# Patient Record
Sex: Male | Born: 1990 | Race: White | Hispanic: No | Marital: Single | Smoking: Current every day smoker
Health system: Southern US, Community
[De-identification: ages and names within clinical notes are randomized; demographics above are authoritative.]

## PROBLEM LIST (undated history)

## (undated) DIAGNOSIS — J45909 Unspecified asthma, uncomplicated: Secondary | ICD-10-CM

## (undated) HISTORY — DX: Unspecified asthma, uncomplicated: J45.909

## (undated) HISTORY — PX: FRACTURE SURGERY: SHX138

---
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
## 2011-07-01 ENCOUNTER — Ambulatory Visit: Payer: BC Managed Care – PPO

## 2011-07-01 ENCOUNTER — Ambulatory Visit: Payer: BC Managed Care – PPO | Admitting: Physician Assistant

## 2011-07-01 VITALS — BP 115/73 | HR 58 | Temp 98.3°F | Resp 18 | Ht 73.0 in | Wt 230.0 lb

## 2011-07-01 DIAGNOSIS — L270 Generalized skin eruption due to drugs and medicaments taken internally: Secondary | ICD-10-CM

## 2011-07-01 DIAGNOSIS — R21 Rash and other nonspecific skin eruption: Secondary | ICD-10-CM

## 2011-07-01 LAB — POCT CBC
Granulocyte percent: 55.5 %G (ref 37–80)
HCT, POC: 44 % (ref 43.5–53.7)
Hemoglobin: 14 g/dL — AB (ref 14.1–18.1)
Lymph, poc: 3.1 (ref 0.6–3.4)
MCH, POC: 28.8 pg (ref 27–31.2)
MCHC: 31.8 g/dL (ref 31.8–35.4)
MCV: 90.5 fL (ref 80–97)
MPV: 10.3 fL (ref 0–99.8)
POC Granulocyte: 4.9 (ref 2–6.9)
POC LYMPH PERCENT: 35.1 %L (ref 10–50)
POC MID %: 9.4 %M (ref 0–12)
Platelet Count, POC: 248 10*3/uL (ref 142–424)
RDW, POC: 14 %
WBC: 8.9 10*3/uL (ref 4.6–10.2)

## 2011-07-01 LAB — POCT SEDIMENTATION RATE: POCT SED RATE: 22 mm/hr (ref 0–22)

## 2011-07-01 NOTE — Progress Notes (Signed)
  Subjective:    Patient ID: Jesus Harrison, male    DOB: July 01, 1990, 21 y.o.   MRN: [id number]  HPI Jesus Harrison c/o rash for 3 days that has painful lesions on palms/soles only. Lesions migrate within these areas but never resolve.  He denies any mouth lesions.  He states that he has had sinus symptoms for a month or so and takes OTC that his mother gave him without much relief but denies other PO meds.  Denies F/C.    Review of Systems  Constitutional: Negative.   HENT: Positive for congestion, sore throat, rhinorrhea, postnasal drip and sinus pressure.   Respiratory: Positive for cough.   Skin: Positive for rash.  Neurological: Negative for headaches.       Objective:   Physical Exam  Constitutional: He appears well-developed and well-nourished.  HENT:  Mouth/Throat: Oropharynx is clear and moist. No oropharyngeal exudate.       Congested nasal passages  Cardiovascular: Normal rate and regular rhythm.   Pulmonary/Chest: Effort normal and breath sounds normal.  Skin: Rash (multiple red slightly raised tender lesions soles/palms to wrist.  Some with slight target appearance) noted.     Results for orders placed in visit on 07/01/11  POCT CBC      Component Value Range   WBC 8.9  4.6 - 10.2 (K/uL)   Lymph, poc 3.1  0.6 - 3.4    POC LYMPH PERCENT 35.1  10 - 50 (%L)   MID (cbc) 0.8  0 - 0.9    POC MID % 9.4  0 - 12 (%M)   POC Granulocyte 4.9  2 - 6.9    Granulocyte percent 55.5  37 - 80 (%G)   RBC 4.86  4.69 - 6.13 (M/uL)   Hemoglobin 14.0 (*) 14.1 - 18.1 (g/dL)   HCT, POC 44.0  43.5 - 53.7 (%)   MCV 90.5  80 - 97 (fL)   MCH, POC 28.8  27 - 31.2 (pg)   MCHC 31.8  31.8 - 35.4 (g/dL)   RDW, POC 14.0     Platelet Count, POC 248  142 - 424 (K/uL)   MPV 10.3  0 - 99.8 (fL)        Assessment & Plan:  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 Kathreen Cosier warnings given. Saline NS for sinus symptoms at this  point to avoid adding new agents.  Call 48hours with status.  Discussed with and examined by Dr. Everlene Farrier

## 2011-07-01 NOTE — Patient Instructions (Signed)
Take Zyrtec 10 mg 1 every 12 hours for 2 days then 1 a day for up to 7-10 days. Watch for any new lesions in mouth, fever, worsening rash. Use Saline nasal sprays for congestion. Try to avoid any medications that have not been recommended today.   Erythema Multiforme Erythema multiforme (EM) is a rash that occurs mostly on the skin. Sometimes it occurs on the lips and mouth. It is usually a mild illness that goes away on its own. It usually affects young adults in the spring and fall. It tends to be recurrent with each episode lasting 1 to 4 weeks. CAUSES  The cause of EM may be an overreaction by the body's immune system to a trigger (something that causes the body to react).  Common triggers include:  Infections, including:   Viruses.   Bacteria.   Fungi.   Parasites.   Medicines.  Less common triggers include:  Foods.   Chemicals.   Injuries to the skin.   Pregnancy.   Other illnesses.  In some cases the cause may not be known. SYMPTOMS  The rash from EM shows up suddenly. The rash may appear days after the trigger. It may start as small, red, round or oval marks that become bumps or raised welts over 24 to 48 hours. These can spread and be quite large (about one inch [several centimeters]). These skin changes usually appear first on the backs of the hands, then spread to the tops of the feet, arms, elbows, knees, palms and soles. There may be a mild rash on the lips and lining of the mouth. The skin rash may show up in waves over a few days. There may be mild itching or burning of the skin at first. It may take up to 4 weeks to go away. The rash may come back again at a later time. DIAGNOSIS  Diagnosis of EM is usually made by physical exam. Sometimes a skin biopsy is done if the diagnosis is not certain. A skin biopsy is the removal of a small piece of tissue which can be examined under a microscope by a specialist (pathologist). TREATMENT  Most episodes of EM heal on  their own and treatment may not be needed. If possible, it is best to remove the trigger or treat the infection. If your trigger is a herpes virus infection (cold sore), use sunscreen lotion and sunscreen-containing lip balm to prevent sunlight triggered outbreaks of herpes virus. Medicine for itching may be given. Medicines can be used for severe cases and to prevent repeat bouts of EM.  HOME CARE INSTRUCTIONS   If possible, avoid known triggers.   If a medicine was your trigger, be sure to notify all of your caregivers. You should avoid this medicine or any like it in the future.  SEEK MEDICAL CARE IF:   Your EM rash shows up again in the future  SEEK IMMEDIATE MEDICAL CARE IF:   Red, swollen lips or mouth develop.   Burning feeling in the mouth or lips.   Blisters or open sores in the mouth, lips, vagina, penis or anus.   Eye pain, redness or drainage.   Blisters on the skin.   Difficulty breathing.   Difficulty swallowing; drooling.   Blood in urine.   Pain with urinating.  Document Released: 04/27/2005 Document Revised: 01/07/2011 Document Reviewed: 04/13/2008 [facility] Patient Information 2012 [place].

## 2011-07-03 ENCOUNTER — Telehealth: Payer: Self-pay

## 2011-07-03 NOTE — Telephone Encounter (Signed)
Jesus Harrison: Pt states that he still has a rash and would like to discuss this with you.

## 2011-07-05 NOTE — Telephone Encounter (Signed)
LMOM to CB. 

## 2011-07-06 NOTE — Telephone Encounter (Signed)
Called pt, no vm. Unable to leave message

## 2011-07-07 NOTE — Telephone Encounter (Signed)
LMOM at cell # to CB. No ans, no VM on H #

## 2011-07-09 NOTE — Telephone Encounter (Signed)
Reached pt who reported that his rash has resolved so he no longer needs anything.

## 2013-01-13 ENCOUNTER — Encounter (HOSPITAL_COMMUNITY): Payer: Self-pay | Admitting: Emergency Medicine

## 2013-01-13 ENCOUNTER — Emergency Department (INDEPENDENT_AMBULATORY_CARE_PROVIDER_SITE_OTHER)
Admission: EM | Admit: 2013-01-13 | Discharge: 2013-01-13 | Disposition: A | Discharge status: Home / Self Care | Payer: BC Managed Care – PPO | Source: Home / Self Care

## 2013-01-13 DIAGNOSIS — T148XXA Other injury of unspecified body region, initial encounter: Secondary | ICD-10-CM

## 2013-01-13 DIAGNOSIS — S139XXA Sprain of joints and ligaments of unspecified parts of neck, initial encounter: Secondary | ICD-10-CM

## 2013-01-13 DIAGNOSIS — S0101XA Laceration without foreign body of scalp, initial encounter: Secondary | ICD-10-CM

## 2013-01-13 DIAGNOSIS — S161XXA Strain of muscle, fascia and tendon at neck level, initial encounter: Secondary | ICD-10-CM

## 2013-01-13 DIAGNOSIS — S0100XA Unspecified open wound of scalp, initial encounter: Secondary | ICD-10-CM

## 2013-01-13 DIAGNOSIS — M549 Dorsalgia, unspecified: Secondary | ICD-10-CM

## 2013-01-13 MED ORDER — TRAMADOL HCL 50 MG PO TABS: 50.0000 mg | ORAL_TABLET | Freq: Four times a day (QID) | ORAL | Status: DC | PRN

## 2013-01-13 MED ORDER — DICLOFENAC POTASSIUM 50 MG PO TABS: 50.0000 mg | ORAL_TABLET | Freq: Three times a day (TID) | ORAL | Status: DC
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
## 2013-01-14 NOTE — ED Provider Notes (Signed)
Medical screening examination/treatment/procedure(s) were performed by resident physician or non-physician practitioner and as supervising physician I was immediately available for consultation/collaboration.   KINDL,JAMES DOUGLAS MD.   James D Kindl, MD 01/14/13 1106 

## 2013-12-06 ENCOUNTER — Ambulatory Visit (INDEPENDENT_AMBULATORY_CARE_PROVIDER_SITE_OTHER): Payer: BC Managed Care – PPO | Admitting: Family Medicine

## 2013-12-06 VITALS — BP 122/70 | HR 88 | Temp 98.1°F | Resp 16 | Ht 72.0 in | Wt 256.4 lb

## 2013-12-06 DIAGNOSIS — B360 Pityriasis versicolor: Secondary | ICD-10-CM

## 2013-12-06 MED ORDER — FLUCONAZOLE 200 MG PO TABS: 200.0000 mg | ORAL_TABLET | Freq: Every day | ORAL | Status: DC

## 2013-12-06 NOTE — Progress Notes (Signed)
Subjective:  This chart was scribed for Jesus Lauenstein, MD by Jesus Harrison, ED Scribe. This pElvina Sidleatient was seen in room 3 and the patient's care was started at 5:01 PM.   Patient ID: Jesus Harrison, male    DOB: Jun 04, 1990, 23 y.o.   MRN: [id number]  Rash Pertinent negatives include no fever.   Chief Complaint  Patient presents with   Rash    x2 months pt states he has an itchy rash.  Located on upper chest, back and some on right/left arm.  denies fever, chills. pt states he has tried OTC creams with no relief.  Also when he gets hot the rash itch more   HPI Comments: Jesus Mosealen Parlin is a 23 y.o. male who presents to the [facility] complaining of a worsening, itchy, erythematous rash located to entire body. Pt reports rash was Harrison and intermittent for the first 2 months but is now constant and has spread to entire body, majority on his back and chest. He reports rash worsens with heat. Pt has tried OTC medication without relief.   Pt is a Engineer, agriculturalproduction worker in a ware house.  There are no active problems to display for this patient.  Past Medical History  Diagnosis Date   Asthma    Past Surgical History  Procedure Laterality Date   Fracture surgery     No Known Allergies Prior to Admission medications   Medication Sig Start Date End Date Taking? Authorizing Provider  DiphenhydrAMINE HCl (BENADRYL ALLERGY PO) Take by mouth as needed.   Yes Historical Provider, MD   History   Social History   Marital Status: Single    Spouse Name: N/A    Number of Children: N/A   Years of Education: N/A   Occupational History   Not on file.   Social History Main Topics   Smoking status: Current Every Day Smoker -- 1.00 packs/day for 6 years    Types: Cigarettes   Smokeless tobacco: Never Used   Alcohol Use: 3.6 oz/week    6 Cans of beer per week     Comment: one drink every 2 weeks   Drug Use: No   Sexual Activity: Not on file   Other Topics Concern     Not on file   Social History Narrative   No narrative on file   Review of Systems  Constitutional: Negative for fever and chills.  Skin: Positive for rash.   Objective:   Physical Exam  Nursing note and vitals reviewed. Constitutional: He is oriented to person, place, and time. He appears well-developed and well-nourished. No distress.  HENT:  Head: Normocephalic and atraumatic.  Eyes: Conjunctivae and EOM are normal.  Neck: Neck supple.  Cardiovascular: Normal rate.   Pulmonary/Chest: Effort normal.  Musculoskeletal: Normal range of motion.  Neurological: He is alert and oriented to person, place, and time.  Skin: Skin is warm and dry. Rash noted.  difusse serpiginous mildly elevated confluent rash on back.  He is has patchy eczematous type rash in the antecubital area.   Psychiatric: He has a normal mood and affect. His behavior is normal.   Filed Vitals:   12/06/13 1622  BP: 122/70  Pulse: 88  Temp: 98.1 F (36.7 C)  Resp: 16       Assessment & Plan:   1. Tinea versicolor    Meds ordered this encounter  Medications   DiphenhydrAMINE HCl (BENADRYL ALLERGY PO)    Sig: Take by mouth as needed.  fluconazole (DIFLUCAN) 200 MG tablet    Sig: Take 1 tablet (200 mg total) by mouth daily. 2 tablets at once today, repeat in one week    Dispense:  4 tablet    Refill:  0    Elvina Sidle, MD

## 2013-12-06 NOTE — Patient Instructions (Signed)
Tinea Versicolor Tinea versicolor is a common yeast infection of the skin. This condition becomes known when the yeast on our skin starts to overgrow (yeast is a normal inhabitant on our skin). This condition is noticed as white or light brown patches on brown skin, and is more evident in the summer on tanned skin. These areas are slightly scaly if scratched. The light patches from the yeast become evident when the yeast creates "holes in your suntan". This is most often noticed in the summer. The patches are usually located on the chest, back, pubis, neck and body folds. However, it may occur on any area of body. Mild itching and inflammation (redness or soreness) may be present. DIAGNOSIS  The diagnosisof this is made clinically (by looking). Cultures from samples are usually not needed. Examination under the microscope may help. However, yeast is normally found on skin. The diagnosis still remains clinical. Examination under Wood's Ultraviolet Light can determine the extent of the infection. TREATMENT  This common infection is usually only of cosmetic (only a concern to your appearance). It is easily treated with dandruff shampoo used during showers or bathing. Vigorous scrubbing will eliminate the yeast over several days time. The light areas in your skin may remain for weeks or months after the infection is cured unless your skin is exposed to sunlight. The lighter or darker spots caused by the fungus that remain after complete treatment are not a sign of treatment failure; it will take a long time to resolve. Your caregiver may recommend a number of commercial preparations or medication by mouth if home care is not working. Recurrence is common and preventative medication may be necessary. This skin condition is not highly contagious. Special care is not needed to protect close friends and family members. Normal hygiene is usually enough. Follow up is required only if you develop complications (such as a  secondary infection from scratching), if recommended by your caregiver, or if no relief is obtained from the preparations used. Document Released: 04/24/2000 Document Revised: 07/20/2011 Document Reviewed: 06/06/2008 [employer] Patient Information 2015 [employer]. This information is not intended to replace advice given to you by your health care provider. Make sure you discuss any questions you have with your health care provider.  

## 2014-11-05 ENCOUNTER — Ambulatory Visit (INDEPENDENT_AMBULATORY_CARE_PROVIDER_SITE_OTHER): Payer: BLUE CROSS/BLUE SHIELD | Admitting: Internal Medicine

## 2014-11-05 VITALS — BP 106/74 | HR 77 | Temp 98.5°F | Resp 16 | Ht 73.0 in | Wt 274.0 lb

## 2014-11-05 DIAGNOSIS — B360 Pityriasis versicolor: Secondary | ICD-10-CM

## 2014-11-05 MED ORDER — FLUCONAZOLE 200 MG PO TABS: ORAL_TABLET | ORAL | Status: DC

## 2014-11-05 NOTE — Progress Notes (Signed)
   Subjective:  This chart was scribed for Ellamae Siaobert Doolittle, MD by Stann Oresung-Kai Tsai, Medical Scribe. This patient was seen in Room 5 and the patient's care was started at 5:17 PM.     Patient ID: Jesus Harrison, male    DOB: [birth year], 24 y.o.   MRN: [id number]  HPI Jesus Harrison is a [age] y.o. male who [facility] complaining of gradual onset rash on his right shoulder. He was here last year with similar symptoms. Last year, he took 2 pills in the past and it resolved the rash. However, it still came back this year. See visit last year with use of Diflucan.   There are no active problems to display for this patient.   Current outpatient prescriptions:  .  DiphenhydrAMINE HCl (BENADRYL ALLERGY PO), Take by mouth as needed., Disp: , Rfl:      Review of Systems  Constitutional: Negative for fever, chills, diaphoresis, fatigue and unexpected weight change.  HENT: Negative for sore throat.   Respiratory: Negative for cough and shortness of breath.   Gastrointestinal: Negative for nausea, vomiting, diarrhea and constipation.  Skin: Positive for rash (right shoulder).  Neurological: Negative for dizziness, numbness and headaches.       Objective:   Physical Exam  Constitutional: He is oriented to person, place, and time. He appears well-developed and well-nourished. No distress.  HENT:  Head: Normocephalic and atraumatic.  Eyes: EOM are normal. Pupils are equal, round, and reactive to light.  Neck: Neck supple.  Cardiovascular: Normal rate.   Pulmonary/Chest: Effort normal. No respiratory distress.  Musculoskeletal: Normal range of motion.  Neurological: He is alert and oriented to person, place, and time.  Skin: Skin is warm and dry.  He has a hypopigmented rash with multiple lesions over his back and chest with fine scaly borders  Psychiatric: He has a normal mood and affect. His behavior is normal.  Nursing note and vitals reviewed.         Assessment & Plan:  Tinea  versi color relapse  Meds ordered this encounter  Medications  . fluconazole (DIFLUCAN) 200 MG tablet    Sig: 1 tablet once a week for 10 weeks    Dispense:  10 tablet    Refill:  0   prolonged treatment to try to prevent relapse  I have completed the patient encounter in its entirety as documented by the scribe, with editing by me where necessary. Robert P. Merla Richesoolittle, M.D.

## 2015-05-28 ENCOUNTER — Emergency Department (HOSPITAL_COMMUNITY): Payer: BLUE CROSS/BLUE SHIELD

## 2015-05-28 ENCOUNTER — Emergency Department (HOSPITAL_COMMUNITY)
Admission: EM | Admit: 2015-05-28 | Discharge: 2015-05-28 | Disposition: A | Discharge status: Home / Self Care | Payer: BLUE CROSS/BLUE SHIELD | Attending: Emergency Medicine | Admitting: Emergency Medicine

## 2015-05-28 ENCOUNTER — Encounter (HOSPITAL_COMMUNITY): Payer: Self-pay

## 2015-05-28 DIAGNOSIS — S60222A Contusion of left hand, initial encounter: Secondary | ICD-10-CM | POA: Diagnosis not present

## 2015-05-28 DIAGNOSIS — S42122A Displaced fracture of acromial process, left shoulder, initial encounter for closed fracture: Secondary | ICD-10-CM

## 2015-05-28 DIAGNOSIS — S0081XA Abrasion of other part of head, initial encounter: Secondary | ICD-10-CM | POA: Insufficient documentation

## 2015-05-28 DIAGNOSIS — S40212A Abrasion of left shoulder, initial encounter: Secondary | ICD-10-CM | POA: Insufficient documentation

## 2015-05-28 DIAGNOSIS — J45909 Unspecified asthma, uncomplicated: Secondary | ICD-10-CM | POA: Insufficient documentation

## 2015-05-28 DIAGNOSIS — S8002XA Contusion of left knee, initial encounter: Secondary | ICD-10-CM | POA: Insufficient documentation

## 2015-05-28 DIAGNOSIS — S50812A Abrasion of left forearm, initial encounter: Secondary | ICD-10-CM | POA: Diagnosis not present

## 2015-05-28 DIAGNOSIS — S8392XA Sprain of unspecified site of left knee, initial encounter: Secondary | ICD-10-CM

## 2015-05-28 DIAGNOSIS — S63502A Unspecified sprain of left wrist, initial encounter: Secondary | ICD-10-CM

## 2015-05-28 DIAGNOSIS — S199XXA Unspecified injury of neck, initial encounter: Secondary | ICD-10-CM | POA: Insufficient documentation

## 2015-05-28 DIAGNOSIS — S0993XA Unspecified injury of face, initial encounter: Secondary | ICD-10-CM

## 2015-05-28 DIAGNOSIS — S0990XA Unspecified injury of head, initial encounter: Secondary | ICD-10-CM

## 2015-05-28 DIAGNOSIS — Y998 Other external cause status: Secondary | ICD-10-CM | POA: Insufficient documentation

## 2015-05-28 DIAGNOSIS — S4992XA Unspecified injury of left shoulder and upper arm, initial encounter: Secondary | ICD-10-CM | POA: Diagnosis present

## 2015-05-28 DIAGNOSIS — S8001XA Contusion of right knee, initial encounter: Secondary | ICD-10-CM | POA: Insufficient documentation

## 2015-05-28 DIAGNOSIS — S60512A Abrasion of left hand, initial encounter: Secondary | ICD-10-CM | POA: Insufficient documentation

## 2015-05-28 DIAGNOSIS — S3991XA Unspecified injury of abdomen, initial encounter: Secondary | ICD-10-CM | POA: Diagnosis not present

## 2015-05-28 DIAGNOSIS — T148XXA Other injury of unspecified body region, initial encounter: Secondary | ICD-10-CM

## 2015-05-28 DIAGNOSIS — Y92410 Unspecified street and highway as the place of occurrence of the external cause: Secondary | ICD-10-CM | POA: Insufficient documentation

## 2015-05-28 DIAGNOSIS — Y9389 Activity, other specified: Secondary | ICD-10-CM | POA: Insufficient documentation

## 2015-05-28 DIAGNOSIS — F17210 Nicotine dependence, cigarettes, uncomplicated: Secondary | ICD-10-CM | POA: Diagnosis not present

## 2015-05-28 LAB — I-STAT CHEM 8, ED
BUN: 11 mg/dL (ref 6–20)
CALCIUM ION: 1.06 mmol/L — AB (ref 1.12–1.23)
Chloride: 109 mmol/L (ref 101–111)
Creatinine, Ser: 1.3 mg/dL — ABNORMAL HIGH (ref 0.61–1.24)
Glucose, Bld: 96 mg/dL (ref 65–99)
HCT: 51 % (ref 39.0–52.0)
Hemoglobin: 17.3 g/dL — ABNORMAL HIGH (ref 13.0–17.0)
Potassium: 3.8 mmol/L (ref 3.5–5.1)
Sodium: 147 mmol/L — ABNORMAL HIGH (ref 135–145)
TCO2: 21 mmol/L (ref 0–100)

## 2015-05-28 LAB — CBC WITH DIFFERENTIAL/PLATELET
BASOS ABS: 0 10*3/uL (ref 0.0–0.1)
Basophils Relative: 0 %
EOS PCT: 2 %
Eosinophils Absolute: 0.3 10*3/uL (ref 0.0–0.7)
HCT: 46.4 % (ref 39.0–52.0)
Hemoglobin: 15.9 g/dL (ref 13.0–17.0)
Lymphocytes Relative: 14 %
Lymphs Abs: 2.4 10*3/uL (ref 0.7–4.0)
MCH: 31 pg (ref 26.0–34.0)
MCHC: 34.3 g/dL (ref 30.0–36.0)
MCV: 90.4 fL (ref 78.0–100.0)
MONO ABS: 0.9 10*3/uL (ref 0.1–1.0)
Monocytes Relative: 5 %
Neutro Abs: 13.4 10*3/uL — ABNORMAL HIGH (ref 1.7–7.7)
Neutrophils Relative %: 79 %
Platelets: 244 10*3/uL (ref 150–400)
RBC: 5.13 MIL/uL (ref 4.22–5.81)
RDW: 13.6 % (ref 11.5–15.5)
WBC: 17.1 10*3/uL — AB (ref 4.0–10.5)

## 2015-05-28 LAB — I-STAT CG4 LACTIC ACID, ED: LACTIC ACID, VENOUS: 1.92 mmol/L (ref 0.5–2.0)

## 2015-05-28 MED ORDER — SODIUM CHLORIDE 0.9 % IV BOLUS (SEPSIS)
1000.0000 mL | Freq: Once | INTRAVENOUS | Status: AC
  Administered 2015-05-28: 1000 mL via INTRAVENOUS

## 2015-05-28 MED ORDER — CYCLOBENZAPRINE HCL 10 MG PO TABS: 10.0000 mg | ORAL_TABLET | Freq: Two times a day (BID) | ORAL | Status: DC | PRN

## 2015-05-28 MED ORDER — HYDROCODONE-ACETAMINOPHEN 5-325 MG PO TABS: 1.0000 | ORAL_TABLET | Freq: Four times a day (QID) | ORAL | Status: DC | PRN

## 2015-05-28 MED ORDER — NAPROXEN 500 MG PO TABS: 500.0000 mg | ORAL_TABLET | Freq: Two times a day (BID) | ORAL | Status: DC

## 2015-05-28 MED ORDER — IOHEXOL 300 MG/ML  SOLN
100.0000 mL | Freq: Once | INTRAMUSCULAR | Status: AC | PRN
  Administered 2015-05-28: 100 mL via INTRAVENOUS

## 2015-05-28 MED ORDER — ONDANSETRON HCL 4 MG/2ML IJ SOLN
4.0000 mg | Freq: Once | INTRAMUSCULAR | Status: AC
  Administered 2015-05-28: 4 mg via INTRAVENOUS
  Filled 2015-05-28: qty 2

## 2015-05-28 MED ORDER — MORPHINE SULFATE (PF) 4 MG/ML IV SOLN
4.0000 mg | Freq: Once | INTRAVENOUS | Status: AC
  Administered 2015-05-28: 4 mg via INTRAVENOUS
  Filled 2015-05-28: qty 1

## 2015-05-28 NOTE — ED Notes (Signed)
EDP at bedside  
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
## 2015-07-09 ENCOUNTER — Ambulatory Visit (INDEPENDENT_AMBULATORY_CARE_PROVIDER_SITE_OTHER): Payer: BLUE CROSS/BLUE SHIELD | Admitting: Family Medicine

## 2015-07-09 DIAGNOSIS — F4310 Post-traumatic stress disorder, unspecified: Secondary | ICD-10-CM | POA: Diagnosis not present

## 2015-07-09 DIAGNOSIS — F4322 Adjustment disorder with anxiety: Secondary | ICD-10-CM

## 2015-07-09 DIAGNOSIS — L501 Idiopathic urticaria: Secondary | ICD-10-CM

## 2015-07-09 MED ORDER — MONTELUKAST SODIUM 10 MG PO TABS: 10.0000 mg | ORAL_TABLET | Freq: Every day | ORAL | Status: DC

## 2015-07-09 MED ORDER — CETIRIZINE HCL 10 MG PO TABS: 10.0000 mg | ORAL_TABLET | Freq: Every day | ORAL | Status: DC

## 2015-07-09 MED ORDER — SERTRALINE HCL 50 MG PO TABS: ORAL_TABLET | ORAL | Status: DC

## 2015-07-09 MED ORDER — HYDROXYZINE HCL 50 MG PO TABS: 50.0000 mg | ORAL_TABLET | ORAL | Status: DC | PRN

## 2015-07-09 MED ORDER — RANITIDINE HCL 150 MG PO TABS: 150.0000 mg | ORAL_TABLET | Freq: Two times a day (BID) | ORAL | Status: DC

## 2015-07-09 NOTE — Patient Instructions (Signed)
Hives Hives are itchy, red, swollen areas of the skin. They can vary in size and location on your body. Hives can come and go for hours or several days (acute hives) or for several weeks (chronic hives). Hives do not spread from person to person (noncontagious). They may get worse with scratching, exercise, and emotional stress. CAUSES   Allergic reaction to food, additives, or drugs.  Infections, including the common cold.  Illness, such as vasculitis, lupus, or thyroid disease.  Exposure to sunlight, heat, or cold.  Exercise.  Stress.  Contact with chemicals. SYMPTOMS   Red or white swollen patches on the skin. The patches may change size, shape, and location quickly and repeatedly.  Itching.  Swelling of the hands, feet, and face. This may occur if hives develop deeper in the skin. DIAGNOSIS  Your caregiver can usually tell what is wrong by performing a physical exam. Skin or blood tests may also be done to determine the cause of your hives. In some cases, the cause cannot be determined. TREATMENT  Mild cases usually get better with medicines such as antihistamines. Severe cases may require an emergency epinephrine injection. If the cause of your hives is known, treatment includes avoiding that trigger.  HOME CARE INSTRUCTIONS   Avoid causes that trigger your hives.  Take antihistamines as directed by your caregiver to reduce the severity of your hives. Non-sedating or low-sedating antihistamines are usually recommended. Do not drive while taking an antihistamine.  Take any other medicines prescribed for itching as directed by your caregiver.  Wear loose-fitting clothing.  Keep all follow-up appointments as directed by your caregiver. SEEK MEDICAL CARE IF:   You have persistent or severe itching that is not relieved with medicine.  You have painful or swollen joints. SEEK IMMEDIATE MEDICAL CARE IF:   You have a fever.  Your tongue or lips are swollen.  You have  trouble breathing or swallowing.  You feel tightness in the throat or chest.  You have abdominal pain. These problems may be the first sign of a life-threatening allergic reaction. Call your local emergency services (911 in [place] MAKE SURE YOU:   Understand these instructions.  Will watch your condition.  Will get help right away if you are not doing well or get worse.   This information is not intended to replace advice given to you by your health care provider. Make sure you discuss any questions you have with your health care provider.   Document Released: 04/27/2005 Document Revised: 05/02/2013 Document Reviewed: 07/21/2011 Elsevier Interactive Patient Education 2016 [employer] Posttraumatic Stress Disorder Posttraumatic stress disorder (PTSD) is a mental disorder. It occurs after a traumatic event in your life. The traumatic events that cause PTSD are outside the range of normal human experience. Examples of these events include war, automobile accidents, natural disasters, rape, domestic violence, and violent crimes. Most people who experience these types of events are able to heal on their own. Those who do not heal develop PTSD. PTSD can happen to anyone at any age. However, people with a history of childhood abuse are at increased risk for developing PTSD.  SYMPTOMS  The traumatic event that causes PTSD must be a threat to life, cause serious injury, or involve sexual violence. The traumatic event is usually experienced directly by the person who develops PTSD. Sometimes PTSD occurs in people who witness traumas that occur to others or who hear about a trauma that occurs to a close family member or friend. The following behaviors  are characteristic of people with PTSD:  People with PTSD re-experience the traumatic event in one or more of the following ways (intrusion symptoms):  Recurrent, unwanted distressing memories while awake.  Recurrent distressing  dreams.  Sensations similar to those felt when the event originally occurred (flashbacks).   Intense or prolonged emotional distress, triggered by reminders of the trauma. This may include fear, horror, intense sadness, or anger.  Marked physical reactions, triggered by reminders of the trauma. This may include racing heart, shortness of breath, sweating, and shaking.  People with PTSD avoid thoughts, conversations, people, or activities that remind them of the traumatic event (avoidance symptoms).  People with PTSD have negative changes in their thinking and mood after the traumatic event. These changes include:  Inability to remember one or more significant aspects of the traumatic event (memory gaps).  Exaggerated negative perceptions about themselves or others, such as believing that they are bad people or that no one can be trusted.  Unrealistic assignment of blame to themselves or others for the traumatic event.  Persistent negative emotional state, such as fear, horror, anger, sadness, guilt, or shame.  Markedly decreased interest or participation in significant activities.  A loss of connection with other people.  Inability to experience positive emotions, such as happiness or love.  People with PTSD are more sensitive to their environment and react more easily than others (hyperarousal-overreactivity symptoms). These symptoms include:  Irritability, with angry outbursts toward other people or objects. The outbursts are easily triggered and may be verbal or physical.  Careless or self-destructive behavior. This may include reckless driving or drug use.  A feeling of being on edge, with increased alertness (hypervigilance).  Exaggerated reactions to stimuli, such as being easily startled.   Difficulty concentrating.  Difficulty sleeping. PTSD symptoms may start soon after a frightening event or months or years later. They last at least 1 month or longer and can  affect one or more areas of functioning, such as social or occupational functioning.  DIAGNOSIS  PTSD is diagnosed through an assessment by a mental health professional. Bonita Quin will be asked questions about the traumatic events in your life. You will also be asked about how these events have changed your thoughts, mood, behavior, and ability to function on a daily basis. You may be asked about your use of alcohol or drugs, which can make PTSD symptoms worse. TREATMENT  Unlike many mental disorders, which require lifelong management, PTSD is a curable condition. The goal of PTSD treatment is to neutralize the negative effects of the traumatic event on daily functioning, not erase the memory of the event. The following treatments may be prescribed to reach this goal:  Medicines. Certain medicines can reduce some PTSD symptoms. Intrusion symptoms and hyperarousal-overactivity symptoms respond best to medicines.  Counseling (talk therapy). Talk therapy with a mental health professional who is experienced in treating PTSD can help. Talk therapy can provide education, emotional support, and coping skills. Certain types of talk therapy that specifically target the traumatic events are the most effective treatment for PTSD:  Prolonged exposure therapy, which involves remembering and processing the traumatic event with a therapist in a safe environment until it no longer creates a negative emotional response.  Eye movement desensitization and reprocessing therapy, which involves the use of repetitive physical stimulation of the senses that alternates between the right and left sides of the body. It is believed that this therapy facilitates communication between the two sides of the brain. This communication helps the  mind to integrate the fragmented memories of the traumatic event into a whole story that makes sense and no longer creates a negative emotional response. Most people with PTSD benefit from a  combination of these treatments.    This information is not intended to replace advice given to you by your health care provider. Make sure you discuss any questions you have with your health care provider.   Document Released: 01/20/2001 Document Revised: 05/18/2014 Document Reviewed: 07/14/2012 Elsevier Interactive Patient Education [employer]
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
## 2015-07-10 ENCOUNTER — Telehealth: Payer: Self-pay

## 2015-07-10 NOTE — Telephone Encounter (Signed)
Pt states he was treated last night and the medication he was given hasn't helped at all, seems to be worse. Please call [phone]    [facility] [place]

## 2015-07-11 ENCOUNTER — Ambulatory Visit (INDEPENDENT_AMBULATORY_CARE_PROVIDER_SITE_OTHER): Payer: BLUE CROSS/BLUE SHIELD | Admitting: Family Medicine

## 2015-07-11 VITALS — BP 120/86 | HR 82 | Temp 98.3°F | Resp 17 | Ht 73.0 in | Wt 270.0 lb

## 2015-07-11 DIAGNOSIS — F411 Generalized anxiety disorder: Secondary | ICD-10-CM | POA: Diagnosis not present

## 2015-07-11 DIAGNOSIS — L500 Allergic urticaria: Secondary | ICD-10-CM

## 2015-07-11 DIAGNOSIS — F4310 Post-traumatic stress disorder, unspecified: Secondary | ICD-10-CM

## 2015-07-11 MED ORDER — PREDNISONE 20 MG PO TABS: ORAL_TABLET | ORAL | Status: DC

## 2015-07-11 MED ORDER — CLONAZEPAM 0.5 MG PO TABS: 0.5000 mg | ORAL_TABLET | Freq: Two times a day (BID) | ORAL | Status: DC | PRN

## 2015-07-11 NOTE — Telephone Encounter (Signed)
Patient has checked in to be seen today.

## 2015-07-11 NOTE — Patient Instructions (Addendum)
Continue your current medications which include Zoloft (sertraline), Zantac (ranitidine), Zyrtec (cetirizine), Singulair (montelukast). You can hold off on the hydroxyzine, but still take it if needed or if it helps.  Take the clonazepam 0.5 mg one twice daily for anxiety  Take the prednisone 20 mg 3 tablets daily for 2 days, then 2 daily for 2 days, then 1 daily for 2 days then one half daily for 4 days  Avoid getting overheated  Return at anytime if worse, or go to the emergency room if necessary  If you're not clearing substantially by tomorrow afternoon you will need to stay off work.  I recommend you see Karmen Bongo or Nicole Cella for counseling:  [phone].  You need to call them to set up your own appointment, but let their office know that I have made the referral and that might help you get on in sooner. Continue to see your pastor for counseling also.
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
## 2015-07-11 NOTE — Telephone Encounter (Addendum)
Needs to come back in 

## 2015-07-12 ENCOUNTER — Telehealth: Payer: Self-pay | Admitting: Family Medicine

## 2015-07-15 ENCOUNTER — Telehealth: Payer: Self-pay

## 2015-07-15 NOTE — Telephone Encounter (Signed)
clonazePAM 0.5 mg are not doing the job and he wanted to let Dr. Alwyn RenHopper know this.  Please advise   [phone]

## 2015-07-16 NOTE — Telephone Encounter (Signed)
  Call: Tell him he can increase clonazepam to 2 pills twice daily.  Then return in about 3 days for recheck.

## 2015-07-16 NOTE — Telephone Encounter (Signed)
Spoke with pt, advised message from Dr. Alwyn RenHopper.

## 2015-08-08 ENCOUNTER — Other Ambulatory Visit: Payer: Self-pay | Admitting: Family Medicine

## 2015-08-12 ENCOUNTER — Other Ambulatory Visit: Payer: Self-pay | Admitting: Family Medicine

## 2015-08-12 DIAGNOSIS — F411 Generalized anxiety disorder: Secondary | ICD-10-CM

## 2015-08-12 MED ORDER — CLONAZEPAM 0.5 MG PO TABS: 0.5000 mg | ORAL_TABLET | Freq: Two times a day (BID) | ORAL | Status: DC | PRN

## 2015-09-03 ENCOUNTER — Other Ambulatory Visit: Payer: Self-pay | Admitting: Family Medicine

## 2015-12-13 ENCOUNTER — Ambulatory Visit (INDEPENDENT_AMBULATORY_CARE_PROVIDER_SITE_OTHER): Payer: BLUE CROSS/BLUE SHIELD | Admitting: Allergy

## 2015-12-13 ENCOUNTER — Encounter: Payer: Self-pay | Admitting: Allergy

## 2015-12-13 VITALS — BP 100/80 | HR 80 | Temp 97.9°F | Resp 16 | Ht 72.84 in | Wt 273.8 lb

## 2015-12-13 DIAGNOSIS — L501 Idiopathic urticaria: Secondary | ICD-10-CM | POA: Diagnosis not present

## 2015-12-13 DIAGNOSIS — T783XXA Angioneurotic edema, initial encounter: Secondary | ICD-10-CM | POA: Diagnosis not present

## 2015-12-13 DIAGNOSIS — H1010 Acute atopic conjunctivitis, unspecified eye: Secondary | ICD-10-CM | POA: Diagnosis not present

## 2015-12-13 DIAGNOSIS — J309 Allergic rhinitis, unspecified: Secondary | ICD-10-CM

## 2015-12-13 MED ORDER — MONTELUKAST SODIUM 10 MG PO TABS: 10.0000 mg | ORAL_TABLET | Freq: Every day | ORAL | 5 refills | Status: DC

## 2015-12-13 MED ORDER — RANITIDINE HCL 150 MG PO TABS: 150.0000 mg | ORAL_TABLET | Freq: Two times a day (BID) | ORAL | 5 refills | Status: DC
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
## 2015-12-13 NOTE — Patient Instructions (Addendum)
1. 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  Will obtain labwork today for chronic urticaria and angioedema: CIU panel, CBC, CMP, ESR, CRP, tryptase, C4.   Avoid NSAID use as can worsen hives.   2. Angioedema, initial encounter Plan as above.   3. Allergic rhinoconjunctivitis Take antihistamines as above for your hives.   Will obtain environmental allergy panel via bloodwork today.   Follow-up 2-3 months

## 2015-12-20 ENCOUNTER — Telehealth: Payer: Self-pay | Admitting: Allergy

## 2015-12-20 DIAGNOSIS — L508 Other urticaria: Secondary | ICD-10-CM

## 2015-12-20 NOTE — Telephone Encounter (Signed)
Reviewed labs.  Will call and review with patient personally on Monday.

## 2015-12-20 NOTE — Telephone Encounter (Signed)
Pt. Called and would like Lab results. Last seen 12/13/15 by Dr. Delorse LekPadgett.

## 2015-12-20 NOTE — Telephone Encounter (Signed)
Can you please look at the labs. Mother would like to know if everything was okay.

## 2015-12-21 LAB — CBC WITH DIFFERENTIAL/PLATELET
Basophils Absolute: 0 10*3/uL (ref 0.0–0.2)
Basos: 0 %
EOS (ABSOLUTE): 0.6 10*3/uL — ABNORMAL HIGH (ref 0.0–0.4)
EOS: 7 %
HEMOGLOBIN: 15.1 g/dL (ref 12.6–17.7)
Hematocrit: 44.1 % (ref 37.5–51.0)
IMMATURE GRANS (ABS): 0 10*3/uL (ref 0.0–0.1)
IMMATURE GRANULOCYTES: 0 %
LYMPHS ABS: 3.3 10*3/uL — AB (ref 0.7–3.1)
Lymphs: 35 %
MCH: 30.9 pg (ref 26.6–33.0)
MCHC: 34.2 g/dL (ref 31.5–35.7)
MCV: 90 fL (ref 79–97)
MONOCYTES: 8 %
MONOS ABS: 0.8 10*3/uL (ref 0.1–0.9)
Neutrophils Absolute: 4.8 10*3/uL (ref 1.4–7.0)
Neutrophils: 50 %
PLATELETS: 238 10*3/uL (ref 150–379)
RBC: 4.88 x10E6/uL (ref 4.14–5.80)
RDW: 14 % (ref 12.3–15.4)
WBC: 9.5 10*3/uL (ref 3.4–10.8)

## 2015-12-21 LAB — IGE: IgE (Immunoglobulin E), Serum: 298 IU/mL — ABNORMAL HIGH (ref 0–100)

## 2015-12-21 LAB — ALLERGENS, ZONE 3
Alternaria Alternata IgE: 0.23 kU/L — AB
Cedar, Mountain IgE: <0.1 kU/L
Cladosporium Herbarum IgE: 0.24 kU/L — AB
D001-IGE D PTERONYSSINUS: 0.47 kU/L — AB
D002-IGE D FARINAE: 0.38 kU/L — AB
E005-IGE DOG DANDER: 0.29 kU/L — AB
Hickory, White IgE: <0.1 kU/L
Johnson Grass IgE: <0.1 kU/L
Kentucky Bluegrass IgE: <0.1 kU/L
M003-IGE ASPERGILLUS FUMIGATUS: 0.36 kU/L — AB
M010-IGE STEMPHYLIUM HERBARUM: 0.11 kU/L — AB
Mucor Racemosus IgE: 0.13 kU/L — AB
Penicillium Chrysogen IgE: <0.1 kU/L
Plantain, English IgE: <0.1 kU/L
Ragweed, Short IgE: <0.1 kU/L
White Mulberry IgE: <0.1 kU/L

## 2015-12-21 LAB — COMPREHENSIVE METABOLIC PANEL
ALBUMIN: 4.6 g/dL (ref 3.5–5.5)
ALK PHOS: 83 IU/L (ref 39–117)
ALT: 13 IU/L (ref 0–44)
AST: 20 IU/L (ref 0–40)
Albumin/Globulin Ratio: 1.6 (ref 1.2–2.2)
BUN / CREAT RATIO: 9 (ref 9–20)
BUN: 8 mg/dL (ref 6–20)
Bilirubin Total: 0.2 mg/dL (ref 0.0–1.2)
CALCIUM: 9.5 mg/dL (ref 8.7–10.2)
CO2: 23 mmol/L (ref 18–29)
CREATININE: 0.87 mg/dL (ref 0.76–1.27)
Chloride: 99 mmol/L (ref 96–106)
GFR calc Af Amer: 140 mL/min/{1.73_m2} (ref >59)
GFR, EST NON AFRICAN AMERICAN: 121 mL/min/{1.73_m2} (ref >59)
GLUCOSE: 95 mg/dL (ref 65–99)
Globulin, Total: 2.8 g/dL (ref 1.5–4.5)
Potassium: 4.4 mmol/L (ref 3.5–5.2)
Sodium: 140 mmol/L (ref 134–144)
TOTAL PROTEIN: 7.4 g/dL (ref 6.0–8.5)

## 2015-12-21 LAB — TRYPTASE: Tryptase: 19 ug/L — ABNORMAL HIGH (ref 2.2–13.2)

## 2015-12-21 LAB — C-REACTIVE PROTEIN: CRP: 11.8 mg/L — AB (ref 0.0–4.9)

## 2015-12-21 LAB — CHRONIC URTICARIA: cu index: 6.4 (ref <10)

## 2015-12-21 LAB — C4 COMPLEMENT: COMPLEMENT C4, SERUM: 34 mg/dL (ref 14–44)

## 2015-12-23 NOTE — Telephone Encounter (Signed)
Reviewed labs with pt over phone today.  CMP, CBC largely unremarkable.  C4 and CIU negative.   Tryptase elevated which is expected, will need to follow overtime as urticaria improves/resolves.  CRP also elevated which is concerning for systemic process(es) as to cause of his hives, mostly urticarial vasculitis given he reports bruising after hive resolves and arthralgia.   Will refer to dermatology at this time for biopsy to rule-out vasculitis.  If positive, then will refer to rheumatology.  He does report decreased frequency with current antihistamine/Singulair regimen which he will continue.   Allergy panel with sensitivity to dust mites, dog and molds with elevated IgE level.  Reviewed avoidance measures.

## 2016-04-13 ENCOUNTER — Ambulatory Visit (INDEPENDENT_AMBULATORY_CARE_PROVIDER_SITE_OTHER): Payer: BLUE CROSS/BLUE SHIELD

## 2016-04-13 ENCOUNTER — Ambulatory Visit (HOSPITAL_COMMUNITY)
Admission: EM | Admit: 2016-04-13 | Discharge: 2016-04-13 | Disposition: A | Discharge status: Home / Self Care | Payer: BLUE CROSS/BLUE SHIELD | Attending: Family Medicine | Admitting: Family Medicine

## 2016-04-13 ENCOUNTER — Encounter (HOSPITAL_COMMUNITY): Payer: Self-pay | Admitting: Emergency Medicine

## 2016-04-13 DIAGNOSIS — K5900 Constipation, unspecified: Secondary | ICD-10-CM

## 2016-04-13 DIAGNOSIS — M25512 Pain in left shoulder: Secondary | ICD-10-CM

## 2016-04-13 DIAGNOSIS — M25519 Pain in unspecified shoulder: Secondary | ICD-10-CM

## 2016-04-13 MED ORDER — POLYETHYLENE GLYCOL 3350 17 G PO PACK: 17.0000 g | PACK | Freq: Every day | ORAL | 0 refills | Status: DC

## 2016-04-13 MED ORDER — NAPROXEN 500 MG PO TABS: 500.0000 mg | ORAL_TABLET | Freq: Two times a day (BID) | ORAL | 0 refills | Status: DC
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
## 2017-12-17 ENCOUNTER — Ambulatory Visit (HOSPITAL_COMMUNITY)
Admission: EM | Admit: 2017-12-17 | Discharge: 2017-12-17 | Disposition: A | Discharge status: Home / Self Care | Payer: BLUE CROSS/BLUE SHIELD | Attending: Family Medicine | Admitting: Family Medicine

## 2017-12-17 ENCOUNTER — Encounter (HOSPITAL_COMMUNITY): Payer: Self-pay

## 2017-12-17 DIAGNOSIS — R21 Rash and other nonspecific skin eruption: Secondary | ICD-10-CM

## 2017-12-17 DIAGNOSIS — F17210 Nicotine dependence, cigarettes, uncomplicated: Secondary | ICD-10-CM | POA: Insufficient documentation

## 2017-12-17 DIAGNOSIS — B360 Pityriasis versicolor: Secondary | ICD-10-CM | POA: Diagnosis not present

## 2017-12-17 DIAGNOSIS — J039 Acute tonsillitis, unspecified: Secondary | ICD-10-CM | POA: Insufficient documentation

## 2017-12-17 DIAGNOSIS — Z885 Allergy status to narcotic agent status: Secondary | ICD-10-CM | POA: Insufficient documentation

## 2017-12-17 DIAGNOSIS — J029 Acute pharyngitis, unspecified: Secondary | ICD-10-CM

## 2017-12-17 DIAGNOSIS — R221 Localized swelling, mass and lump, neck: Secondary | ICD-10-CM | POA: Diagnosis present

## 2017-12-17 MED ORDER — AMOXICILLIN-POT CLAVULANATE 875-125 MG PO TABS: 1.0000 | ORAL_TABLET | Freq: Two times a day (BID) | ORAL | 0 refills | Status: DC

## 2017-12-17 MED ORDER — FLUCONAZOLE 200 MG PO TABS: ORAL_TABLET | ORAL | 0 refills | Status: DC

## 2017-12-17 NOTE — Discharge Instructions (Addendum)
For the rash you needs to take 2 of the antifungal tablets.  Wait a couple hours.  Exercise to cause perspiration. Repeat in 1 or 2 weeks if needed May continue using Selsun wash  Push fluids Take the augmentin 2 x a day Return or go to the ER if worse at any time, or if you have difficulty swallowing Expect improvement in a couple of days

## 2017-12-17 NOTE — ED Provider Notes (Signed)
[facility]    CSN: [id number] Arrival date & time: 12/17/17  1054     History   Chief Complaint Chief Complaint  Patient presents with  . lump on right side of neck    HPI Jesus Harrison is a 27 y.o. male.   HPI  Patient has had a rash on his back and upper extremities for months.  He is using Selsun shampoo.  It is not working.  He was prescribed a prednisone pack.  This made the rash worse.  It does not itch. Patient also has a sore throat.  Is been going on for couple days.  Is painful to swallow.  He has long-term problems with his tonsils and that they are large and cryptic, often catch debris, often get infected.  Right now he has pain on the right side of his throat, swelling more on the right than the left, and a swollen gland on the right side of his neck.  He has been having some sweats and chills.  He did not take his temperature.  Headaches for a week or 2.  No nausea or vomiting.  No body aches or myalgias.  No known exposure to strep.  Past Medical History:  Diagnosis Date  . Asthma    Resolved from childhood    Patient Active Problem List   Diagnosis Date Noted  . Idiopathic urticaria 12/13/2015  . Angioedema 12/13/2015  . Allergic rhinoconjunctivitis 12/13/2015    Past Surgical History:  Procedure Laterality Date  . FRACTURE SURGERY         Home Medications    Prior to Admission medications   Medication Sig Start Date End Date Taking? Authorizing Provider  amoxicillin-clavulanate (AUGMENTIN) 875-125 MG tablet Take 1 tablet by mouth every 12 (twelve) hours. 12/17/17   Eustace MooreNelson, Freddi Forster Sue, MD  fluconazole (DIFLUCAN) 200 MG tablet Take 2 tablets.  Weight 1 to 2 hours.  Exercise.  Repeat if needed 12/17/17   Eustace MooreNelson, Sotirios Navarro Sue, MD    Family History Family History  Problem Relation Age of Onset  . Allergic rhinitis Neg Hx   . Angioedema Neg Hx   . Asthma Neg Hx   . Atopy Neg Hx   . Eczema Neg Hx   . Urticaria Neg Hx   . Immunodeficiency  Neg Hx     Social History Social History   Tobacco Use  . Smoking status: Current Every Day Smoker    Packs/day: 1.00    Years: 6.00    Pack years: 6.00    Types: Cigarettes  . Smokeless tobacco: Current User  Substance Use Topics  . Alcohol use: Yes    Alcohol/week: 6.0 standard drinks    Types: 6 Cans of beer per week    Comment: one drink every 2 weeks  . Drug use: No     Allergies   Hydrocodone   Review of Systems Review of Systems  Constitutional: Negative for chills and fever.  HENT: Positive for sore throat. Negative for congestion, ear pain, postnasal drip, rhinorrhea and trouble swallowing.   Eyes: Negative for pain and visual disturbance.  Respiratory: Negative for cough and shortness of breath.   Cardiovascular: Negative for chest pain and palpitations.  Gastrointestinal: Negative for abdominal pain and vomiting.  Genitourinary: Negative for dysuria and hematuria.  Musculoskeletal: Negative for arthralgias and back pain.  Skin: Negative for color change and rash.  Neurological: Negative for seizures and syncope.  Hematological: Positive for adenopathy.  Psychiatric/Behavioral: Negative for sleep disturbance.  The patient is not nervous/anxious.   All other systems reviewed and are negative.    Physical Exam Triage Vital Signs ED Triage Vitals [12/17/17 1119]  Enc Vitals Group     BP 131/76     Pulse Rate 80     Resp 20     Temp 98.1 F (36.7 C)     Temp Source Oral     SpO2 92 %   No data found.  Updated Vital Signs BP 131/76 (BP Location: Left Arm)   Pulse 80   Temp 98.1 F (36.7 C) (Oral)   Resp 20   SpO2 92%         Physical Exam  Constitutional: He appears well-developed and well-nourished. No distress.  HENT:  Head: Normocephalic and atraumatic.  Right Ear: External ear normal.  Left Ear: External ear normal.  Mouth/Throat: Oropharyngeal exudate present.  Asymmetry of tonsils with right more swollen than left, some soft tissue  swelling in the right tonsillar pillar and adjacent soft palate.  Erythematous.  Exudate versus debris on both tonsils.  Eyes: Pupils are equal, round, and reactive to light. Conjunctivae are normal.  Neck: Normal range of motion.  2x3 cm, mobile, tender swollen gland of the right angle of the jaw  Cardiovascular: Normal rate, regular rhythm and normal heart sounds.  Pulmonary/Chest: Effort normal and breath sounds normal. No respiratory distress. He has no wheezes.  Abdominal: Soft. He exhibits no distension.  No hepatosplenomegaly  Musculoskeletal: Normal range of motion. He exhibits no edema.  Lymphadenopathy:    He has cervical adenopathy.  Neurological: He is alert.  Skin: Skin is warm and dry.  Hypopigmented circular lesions on both deltoid regions and across upper back     UC Treatments / Results  Labs (all labs ordered are listed, but only abnormal results are displayed) Labs Reviewed  CULTURE, GROUP A STREP [facility])    EKG None  Radiology No results found.  Procedures Procedures (including critical care time)  Medications Ordered in UC Medications - No data to display  Initial Impression / Assessment and Plan / UC Course  I have reviewed the triage vital signs and the nursing notes.  Pertinent labs & imaging results that were available during my care of the patient were reviewed by me and considered in my medical decision making (see chart for details).  Clinical Course as of Dec 18 1427  Fri Dec 17, 2017  1146 POCT Rapid Strep A [YN]    Clinical Course User Index [YN] Eustace Moore, MD    Strep is negative.  Because of the asymmetry in the tonsils, and the swelling of the soft palate I believe he has an early tonsillar abscess versus cellulitis.  And will treat him with Augmentin.  I encouraged him to call in 2 to 3 days to check on his culture report.  He may need to see ENT if he  fails to improve.  He is given strict instructions to return ASAP if he  has difficulty swallowing or breathing due to the infection in his tonsil. Final Clinical Impressions(s) / UC Diagnoses   Final diagnoses:  Tinea versicolor  Acute tonsillitis, unspecified etiology     Discharge Instructions     For the rash you needs to take 2 of the antifungal tablets.  Wait a couple hours.  Exercise to cause perspiration. Repeat in 1 or 2 weeks if needed May continue using Selsun wash  Push fluids Take the augmentin 2 x a  day Return or go to the ER if worse at any time, or if you have difficulty swallowing Expect improvement in a couple of days     ED Prescriptions    Medication Sig Dispense Auth. Provider   fluconazole (DIFLUCAN) 200 MG tablet Take 2 tablets.  Weight 1 to 2 hours.  Exercise.  Repeat if needed 4 tablet Eustace Moore, MD   amoxicillin-clavulanate (AUGMENTIN) 875-125 MG tablet Take 1 tablet by mouth every 12 (twelve) hours. 20 tablet Eustace Moore, MD     Controlled Substance Prescriptions [place] Controlled Substance Registry consulted? Not Applicable   Eustace Moore, MD 12/17/17 1430

## 2017-12-17 NOTE — ED Triage Notes (Signed)
Pt presents with lump on right side of neck and rash like spots on his back

## 2017-12-19 LAB — CULTURE, GROUP A STREP (THRC)

## 2023-03-28 ENCOUNTER — Ambulatory Visit (INDEPENDENT_AMBULATORY_CARE_PROVIDER_SITE_OTHER): Payer: Self-pay

## 2023-03-28 ENCOUNTER — Ambulatory Visit (HOSPITAL_COMMUNITY): Payer: Self-pay

## 2023-03-28 ENCOUNTER — Ambulatory Visit (HOSPITAL_COMMUNITY)
Admission: EM | Admit: 2023-03-28 | Discharge: 2023-03-28 | Disposition: A | Discharge status: Home / Self Care | Payer: Self-pay | Attending: Family Medicine | Admitting: Family Medicine

## 2023-03-28 ENCOUNTER — Encounter (HOSPITAL_COMMUNITY): Payer: Self-pay | Admitting: Emergency Medicine

## 2023-03-28 DIAGNOSIS — M25571 Pain in right ankle and joints of right foot: Secondary | ICD-10-CM

## 2023-03-28 DIAGNOSIS — M79671 Pain in right foot: Secondary | ICD-10-CM

## 2023-03-28 MED ORDER — KETOROLAC TROMETHAMINE 10 MG PO TABS: 10.0000 mg | ORAL_TABLET | Freq: Four times a day (QID) | ORAL | 0 refills | Status: AC | PRN

## 2023-03-28 MED ORDER — KETOROLAC TROMETHAMINE 30 MG/ML IJ SOLN
INTRAMUSCULAR | Status: AC
  Filled 2023-03-28: qty 1

## 2023-03-28 MED ORDER — KETOROLAC TROMETHAMINE 30 MG/ML IJ SOLN
30.0000 mg | Freq: Once | INTRAMUSCULAR | Status: AC
  Administered 2023-03-28: 30 mg via INTRAMUSCULAR

## 2023-03-28 NOTE — Discharge Instructions (Addendum)
There were no broken bones on your x-ray.  You have been given a shot of Toradol 30 mg today.  Ketorolac 10 mg tablets--take 1 tablet every 6 hours as needed for pain.  This is the same medicine that is in the shot we just gave you  Ice and elevate your ankle and foot when you can.

## 2023-03-28 NOTE — ED Triage Notes (Signed)
Pt c/o right ankle and foot pain after he fell down stairs and twisted his ankle a few days ago.

## 2023-03-28 NOTE — ED Provider Notes (Signed)
[facility]    CSN: [id number] Arrival date & time: 03/28/23  1642      History   Chief Complaint Chief Complaint  Patient presents with   Foot Injury    HPI Jesus Harrison is a 32 y.o. male.    Foot Injury Here for right lateral foot and ankle pain.  2 days ago he fell down stairs and turned his right ankle. 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 Authorizing Provider  ketorolac (TORADOL) 10 MG tablet Take 1 tablet (10 mg total) by mouth every 6 (six) hours as needed (pain). 03/28/23  Yes Zenia Resides, MD    Family History Family History  Problem Relation Age of Onset   Allergic rhinitis Neg Hx    Angioedema Neg Hx    Asthma Neg Hx    Atopy Neg Hx    Eczema Neg Hx    Urticaria Neg Hx    Immunodeficiency Neg Hx     Social History Social History   Tobacco Use   Smoking status: Every Day    Current packs/day: 1.00    Average packs/day: 1 pack/day for 6.0 years (6.0 ttl pk-yrs)    Types: Cigarettes   Smokeless tobacco: Current  Substance Use Topics   Alcohol use: Yes    Alcohol/week: 6.0 standard drinks of alcohol    Types: 6 Cans of beer per week    Comment: one drink every 2 weeks   Drug use: No     Allergies   Hydrocodone   Review of Systems Review of Systems   Physical Exam Triage Vital Signs ED Triage Vitals  Encounter Vitals Group     BP 03/28/23 1759 124/85     Systolic BP Percentile --      Diastolic BP Percentile --      Pulse Rate 03/28/23 1759 77     Resp 03/28/23 1759 16     Temp  03/28/23 1759 (!) 97.5 F (36.4 C)     Temp Source 03/28/23 1759 Oral     SpO2 03/28/23 1759 95 %     Weight --      Height --      Head Circumference --      Peak Flow --      Pain Score 03/28/23 1801 7     Pain Loc --      Pain Education --      Exclude from Growth Chart --    No data found.  Updated Vital Signs BP 124/85 (BP Location: Left Arm)   Pulse 77   Temp (!) 97.5 F (36.4 C) (Oral)   Resp 16   SpO2 95%   Visual Acuity Right Eye Distance:   Left Eye Distance:   Bilateral Distance:    Right Eye Near:   Left Eye Near:    Bilateral Near:     Physical Exam   UC Treatments / Results  Labs (all labs ordered are listed, but only abnormal results are  displayed) Labs Reviewed - No data to display  EKG   Radiology DG Ankle Complete Right  Result Date: 03/28/2023 CLINICAL DATA:  Right ankle injury. EXAM: RIGHT ANKLE - COMPLETE 3+ VIEW COMPARISON:  None Available. FINDINGS: There is no evidence of fracture, dislocation, or joint effusion. There is no evidence of arthropathy or other focal bone abnormality. Soft tissues are unremarkable. IMPRESSION: Negative. Electronically Signed   By: Ted Mcalpine M.D.   On: 03/28/2023 18:44   DG Foot Complete Right  Result Date: 03/28/2023 CLINICAL DATA:  Larey Seat, twisted ankle several days ago EXAM: RIGHT FOOT COMPLETE - 3+ VIEW COMPARISON:  None Available. FINDINGS: Frontal, oblique, and lateral views of the right foot are obtained. No acute fracture, subluxation, or dislocation. Joint spaces are well preserved. Soft tissues are unremarkable. IMPRESSION: 1. Unremarkable right foot. Electronically Signed   By: Sharlet Salina M.D.   On: 03/28/2023 18:43    Procedures Procedures (including critical care time)  Medications Ordered in UC Medications  ketorolac (TORADOL) 30 MG/ML injection 30 mg (has no administration in time range)    Initial Impression / Assessment and Plan / UC Course  I have reviewed the triage vital  signs and the nursing notes.  Pertinent labs & imaging results that were available during my care of the patient were reviewed by me and considered in my medical decision making (see chart for details).     No fractures are seen on x-ray.  Toradol injection is given here and Toradol tablets are sent to the pharmacy.  Ace wrap is supplied.   Final Clinical Impressions(s) / UC Diagnoses   Final diagnoses:  Right foot pain  Acute right ankle pain     Discharge Instructions      There were no broken bones on your x-ray.  You have been given a shot of Toradol 30 mg today.  Ketorolac 10 mg tablets--take 1 tablet every 6 hours as needed for pain.  This is the same medicine that is in the shot we just gave you  Ice and elevate your ankle and foot when you can.     ED Prescriptions     Medication Sig Dispense Auth. Provider   ketorolac (TORADOL) 10 MG tablet Take 1 tablet (10 mg total) by mouth every 6 (six) hours as needed (pain). 20 tablet [name], Janace Aris, MD      PDMP not reviewed this encounter.   Zenia Resides, MD 03/28/23 [phone]
